# Patient Record
Sex: Female | Born: 1960 | Hispanic: Yes | Marital: Married | Smoking: Never smoker
Health system: Southern US, Community
[De-identification: ages and names within clinical notes are randomized; demographics above are authoritative.]

---

## 2014-02-28 ENCOUNTER — Emergency Department: Payer: Self-pay | Admitting: Emergency Medicine

## 2015-02-12 ENCOUNTER — Ambulatory Visit
Admission: RE | Admit: 2015-02-12 | Discharge: 2015-02-12 | Disposition: A | Discharge status: Home / Self Care | Payer: Self-pay | Source: Ambulatory Visit | Attending: Oncology | Admitting: Oncology

## 2015-02-12 ENCOUNTER — Ambulatory Visit: Payer: Self-pay | Attending: Oncology

## 2015-02-12 VITALS — BP 166/86 | HR 96 | Temp 97.5°F | Resp 20 | Ht 58.27 in | Wt 158.4 lb

## 2015-02-12 DIAGNOSIS — Z0000 Encounter for general adult medical examination without abnormal findings: Secondary | ICD-10-CM

## 2015-02-12 NOTE — Progress Notes (Signed)
Subjective:     Patient ID: Jamie Silva, female   DOB: [birth year], 54 y.o.   MRN: [id number]  HPI   Review of Systems     Objective:   Physical Exam  Pulmonary/Chest: Right breast exhibits no inverted nipple, no mass, no nipple discharge, no skin change and no tenderness. Left breast exhibits no inverted nipple, no mass, no nipple discharge, no skin change and no tenderness. Breasts are symmetrical.       Assessment:     54 year old hispanic patient presents for BCCCP clinic visit.  Patient screened, and meets BCCCP eligibility.  Patient does not have insurance, Medicare or Medicaid.  Handout given on Affordable Care Act.  No previous mammogram.  Instructed patient on breast self-exam using teach back method.  CBE unremarkable. No mass or lump palpated.    Plan:     Sent for bilateral screening mammogram.  Delos HaringLoyda Murr interpreted exam.

## 2015-02-25 NOTE — Progress Notes (Signed)
Letter mailed from [facility] to notify of normal mammogram results.  Patient to return in one year for annual screening.  Copy to [facility]. 

## 2016-12-02 DIAGNOSIS — T7840XA Allergy, unspecified, initial encounter: Secondary | ICD-10-CM | POA: Insufficient documentation

## 2016-12-02 MED ORDER — FAMOTIDINE 20 MG PO TABS
20.0000 mg | ORAL_TABLET | Freq: Once | ORAL | Status: AC
  Administered 2016-12-02: 20 mg via ORAL

## 2016-12-02 MED ORDER — FAMOTIDINE 20 MG PO TABS
ORAL_TABLET | ORAL | Status: AC
  Administered 2016-12-02: 20 mg via ORAL
  Filled 2016-12-02: qty 1

## 2016-12-02 MED ORDER — DIPHENHYDRAMINE HCL 25 MG PO CAPS
25.0000 mg | ORAL_CAPSULE | Freq: Once | ORAL | Status: AC
Start: 2016-12-02 — End: 2016-12-02
  Administered 2016-12-02: 25 mg via ORAL

## 2016-12-02 MED ORDER — PREDNISONE 20 MG PO TABS
ORAL_TABLET | ORAL | Status: AC
  Administered 2016-12-02: 60 mg via ORAL
  Filled 2016-12-02: qty 1

## 2016-12-02 MED ORDER — DIPHENHYDRAMINE HCL 25 MG PO CAPS
ORAL_CAPSULE | ORAL | Status: AC
  Administered 2016-12-02: 25 mg via ORAL
  Filled 2016-12-02: qty 1

## 2016-12-02 MED ORDER — PREDNISONE 20 MG PO TABS
60.0000 mg | ORAL_TABLET | Freq: Once | ORAL | Status: AC
  Administered 2016-12-02: 60 mg via ORAL

## 2016-12-02 NOTE — ED Triage Notes (Signed)
Pt has had hives for 3 days unsure of cause. States not improving took some benadryl this am, and did improve after taking it. States now in the evening hives have worsened again.

## 2016-12-03 ENCOUNTER — Emergency Department
Admission: EM | Admit: 2016-12-03 | Discharge: 2016-12-03 | Disposition: A | Discharge status: Home / Self Care | Payer: Self-pay | Attending: Emergency Medicine | Admitting: Emergency Medicine

## 2016-12-03 DIAGNOSIS — T7840XA Allergy, unspecified, initial encounter: Secondary | ICD-10-CM

## 2016-12-03 MED ORDER — DEXAMETHASONE SODIUM PHOSPHATE 10 MG/ML IJ SOLN
10.0000 mg | Freq: Once | INTRAMUSCULAR | Status: AC
  Administered 2016-12-03: 10 mg via INTRAMUSCULAR
  Filled 2016-12-03: qty 1

## 2016-12-03 MED ORDER — ONDANSETRON 4 MG PO TBDP
4.0000 mg | ORAL_TABLET | Freq: Once | ORAL | Status: AC
  Administered 2016-12-03: 4 mg via ORAL
  Filled 2016-12-03: qty 1

## 2016-12-03 NOTE — ED Provider Notes (Signed)
[facility] Emergency Department Provider Note  Time seen: 1:25 AM  I have reviewed the triage vital signs and the nursing notes.   HISTORY  Chief Complaint Rash    HPI Jamie Silva is a 56 y.o. female With a past medical history of diabetes who presents to the emergency department with hives. According to the patient for the past 2-3 days she has had a rash and hives all over her body. States she ate shellfish the day before the rash started but denies any other new changes. Patient did state she was put on new blood sugar medications on Monday but states she has not yet taken those medications. Denies any trouble breathing. Patient took Benadryl this morning and states the rash cleared up but then returned this afternoon so she came to the ER. Patient states mild nausea after receiving medications in the ER with one or 2 episodes of vomiting. Denies any nausea at home. Denies any shortness of breath. Denies any swelling.  No past medical history on file.  There are no active problems to display for this patient.   No past surgical history on file.  Prior to Admission medications   Not on File    No Known Allergies  No family history on file.  Social History Social History  Substance Use Topics  . Smoking status: Not on file  . Smokeless tobacco: Not on file  . Alcohol use Not on file    Review of Systems Constitutional: Negative for fever. Cardiovascular: Negative for chest pain. Respiratory: Negative for shortness of breath. Gastrointestinal: Negative for abdominal pain. Nausea with one episode of vomiting Skin: positive for rash, hives All other ROS negative  ____________________________________________   PHYSICAL EXAM:  VITAL SIGNS: ED Triage Vitals [12/02/16 2237]  Enc Vitals Group     BP 132/72     Pulse Rate 97     Resp 20     Temp 98 F (36.7 C)     Temp Source Oral     SpO2 100 %     Weight 125 lb 10.6  oz (57 kg)     Height      Head Circumference      Peak Flow      Pain Score      Pain Loc      Pain Edu?      Excl. in GC?     Constitutional: Alert. Well appearing and in no distress. Eyes: Normal exam ENT   Head: Normocephalic and atraumatic.   Mouth/Throat: Mucous membranes are moist. no edema noted Cardiovascular: Normal rate, regular rhythm. No murmur Respiratory: Normal respiratory effort without tachypnea nor retractions. Breath sounds are clear Gastrointestinal: Soft and nontender. No distention.  Musculoskeletal: Nontender with normal range of motion in all extremities.  Neurologic:  Normal speech and language. No gross focal neurologic deficits Skin:  patient has fairly diffuse urticaria, mild to moderate in severity. Psychiatric: Mood and affect are normal.   ____________________________________________  INITIAL IMPRESSION / ASSESSMENT AND PLAN / ED COURSE  Pertinent labs & imaging results that were available during my care of the patient were reviewed by me and considered in my medical decision making (see chart for details).  patient presents to the emergency department with a rash. Differential this time would include allergic reaction, histamine response, viral rash.rash overall looks most consistent with allergic reaction/urticaria. Patient vomited shortly after receiving the medications, denies any nausea at home. We will dose of ODT Zofran.  I will also does a one-time dose of IM Decadron. I discussed with the patient taking Benadryl every 6 hours as needed if the rash returns. I also discussed following up with her primary care doctor for possible allergist referral if she continues to have hives.overall the patient appears very well, no difficulty breathing, no edema and no wheeze.  ____________________________________________   FINAL CLINICAL IMPRESSION(S) / ED DIAGNOSES  allergic reaction    Minna Antis, MD 12/03/16 0130

## 2016-12-03 NOTE — ED Notes (Signed)

## 2019-07-04 ENCOUNTER — Other Ambulatory Visit: Payer: Self-pay

## 2019-07-04 ENCOUNTER — Ambulatory Visit: Payer: Self-pay | Attending: Oncology

## 2019-07-04 ENCOUNTER — Encounter (INDEPENDENT_AMBULATORY_CARE_PROVIDER_SITE_OTHER): Payer: Self-pay

## 2019-07-04 ENCOUNTER — Ambulatory Visit
Admission: RE | Admit: 2019-07-04 | Discharge: 2019-07-04 | Disposition: A | Discharge status: Home / Self Care | Payer: Self-pay | Source: Ambulatory Visit | Attending: Oncology | Admitting: Oncology

## 2019-07-04 VITALS — BP 136/52 | HR 75 | Temp 97.4°F | Ht 59.5 in | Wt 143.9 lb

## 2019-07-04 DIAGNOSIS — Z0000 Encounter for general adult medical examination without abnormal findings: Secondary | ICD-10-CM | POA: Insufficient documentation

## 2019-07-04 NOTE — Progress Notes (Signed)
  Subjective:     Patient ID: Jamie Silva, female   DOB: 1961-02-04, 59 y.o.   MRN: [id number]  HPI   Review of Systems     Objective:   Physical Exam Chest:       Comments: White thickened area on right areola Genitourinary:    Labia:        Right: No rash, tenderness, lesion or injury.        Left: No rash, tenderness, lesion or injury.      Urethra: Prolapse present.     Vagina: No signs of injury and foreign body. No vaginal discharge, erythema, tenderness, bleeding, lesions or prolapsed vaginal walls.     Cervix: No cervical motion tenderness, discharge, friability, lesion, erythema, cervical bleeding or eversion.     Uterus: Not deviated, not enlarged, not fixed, not tender and no uterine prolapse.      Adnexa:        Right: No mass, tenderness or fullness.         Left: No mass, tenderness or fullness.          Assessment:     59 year old hispanic patient returns for BCCCP screening. Patient screened, and meets BCCCP eligibility.  Patient does not have insurance, Medicare or Medicaid. Instructed patient on breast self awareness using teach back method. Clinical breast exam unremarkable. No mass or lump palpated.  Noted a white rough skin on right areola.  Patient states it is there after she showers.  Does not itch. Will monitor mammogram results.  Patient to follow-up with provider.  Pelvic exam scan bleeding on pap.  Risk Assessment    Risk Scores      07/04/2019   Last edited by: Jim Like, RN   5-year risk: 0.8 %   Lifetime risk: 4.9 %            Plan:     Sent for bilateral screening mammogram. Specimen collected for pap.

## 2019-07-06 NOTE — Progress Notes (Signed)
Letter mailed from [facility] to notify of normal mammogram results.  Pap results pending.  Letter mailed to notify of normal pap results. Copy to [facility].

## 2019-07-07 LAB — IGP, APTIMA HPV: HPV Aptima: NEGATIVE

## 2021-01-04 ENCOUNTER — Encounter: Payer: Self-pay | Admitting: Emergency Medicine

## 2021-01-04 ENCOUNTER — Emergency Department
Admission: EM | Admit: 2021-01-04 | Discharge: 2021-01-04 | Disposition: A | Discharge status: Home / Self Care | Payer: No Typology Code available for payment source | Attending: Student in an Organized Health Care Education/Training Program | Admitting: Student in an Organized Health Care Education/Training Program

## 2021-01-04 ENCOUNTER — Other Ambulatory Visit: Payer: Self-pay

## 2021-01-04 DIAGNOSIS — R251 Tremor, unspecified: Secondary | ICD-10-CM | POA: Insufficient documentation

## 2021-01-04 DIAGNOSIS — S199XXA Unspecified injury of neck, initial encounter: Secondary | ICD-10-CM | POA: Diagnosis present

## 2021-01-04 DIAGNOSIS — M7918 Myalgia, other site: Secondary | ICD-10-CM

## 2021-01-04 MED ORDER — NAPROXEN 500 MG PO TABS: 500.0000 mg | ORAL_TABLET | Freq: Two times a day (BID) | ORAL | 0 refills | Status: AC

## 2021-01-04 MED ORDER — NAPROXEN 500 MG PO TABS
500.0000 mg | ORAL_TABLET | Freq: Once | ORAL | Status: AC
  Administered 2021-01-04: 500 mg via ORAL
  Filled 2021-01-04: qty 1

## 2021-01-04 MED ORDER — DIAZEPAM 2 MG PO TABS
2.0000 mg | ORAL_TABLET | Freq: Once | ORAL | Status: AC
  Administered 2021-01-04: 2 mg via ORAL
  Filled 2021-01-04: qty 1

## 2021-01-04 MED ORDER — METHOCARBAMOL 500 MG PO TABS: 500.0000 mg | ORAL_TABLET | Freq: Three times a day (TID) | ORAL | 0 refills | Status: AC | PRN
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
## 2021-01-09 IMAGING — MG DIGITAL SCREENING BILAT W/ TOMO W/ CAD
8 series · 9 of 24 positions shown · non-contrast
Comparison: Previous exam(s).

CLINICAL DATA: Screening.

EXAM:
DIGITAL SCREENING BILATERAL MAMMOGRAM WITH TOMO AND CAD

[L CC synth-2D]
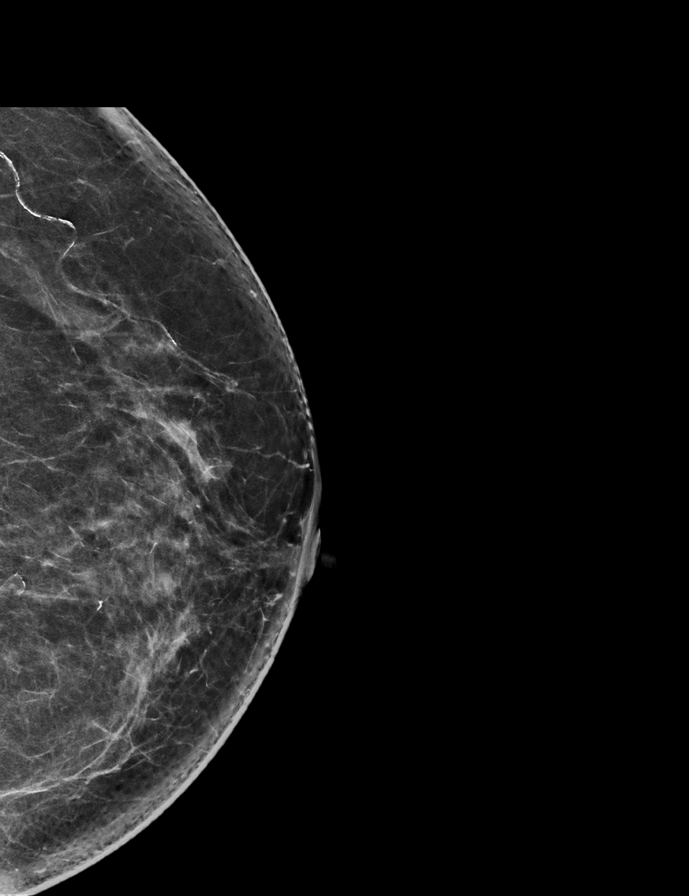

[R CC synth-2D]
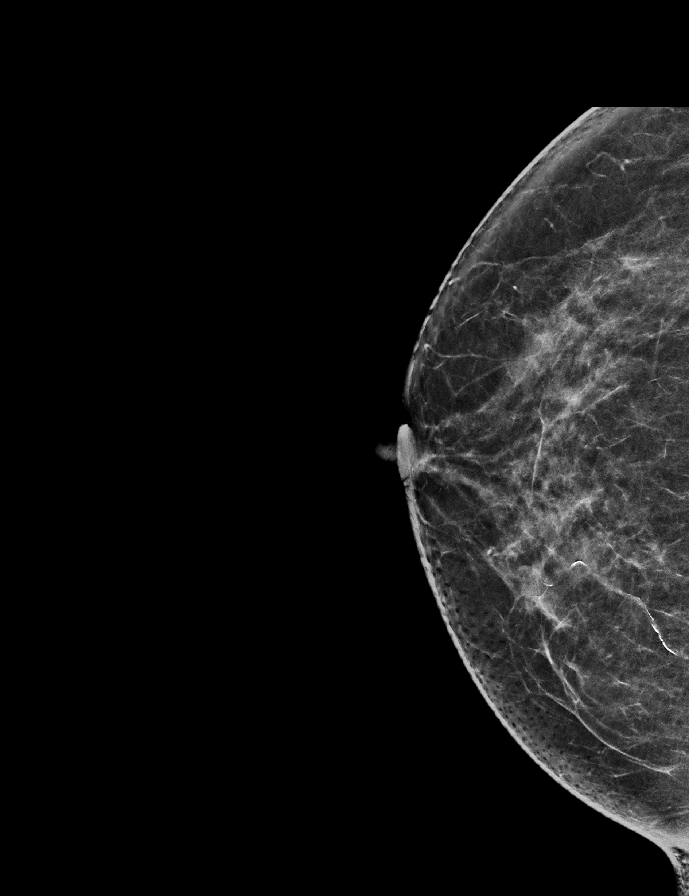

[R MLO synth-2D]
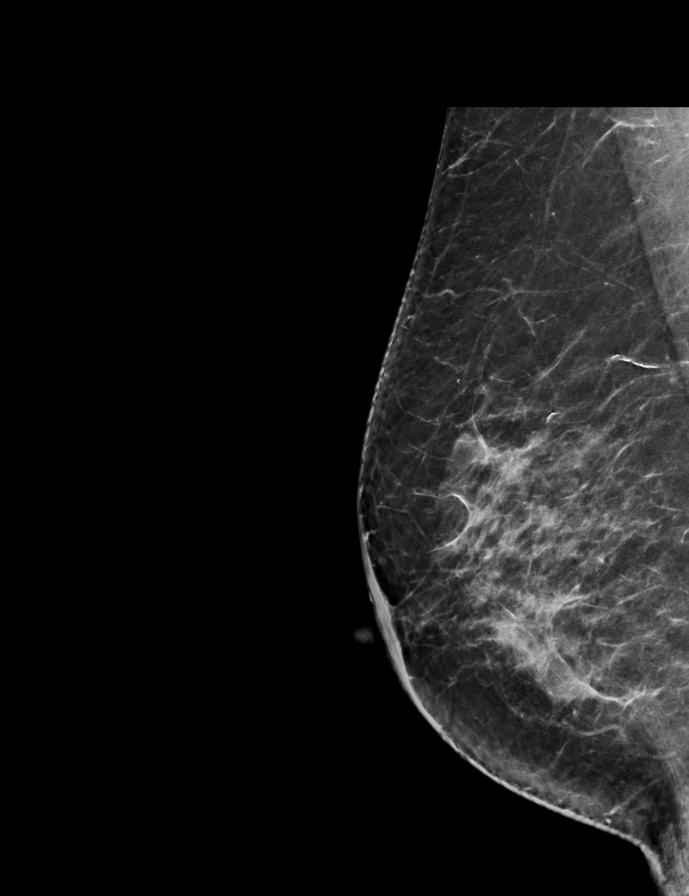

[L MLO synth-2D]
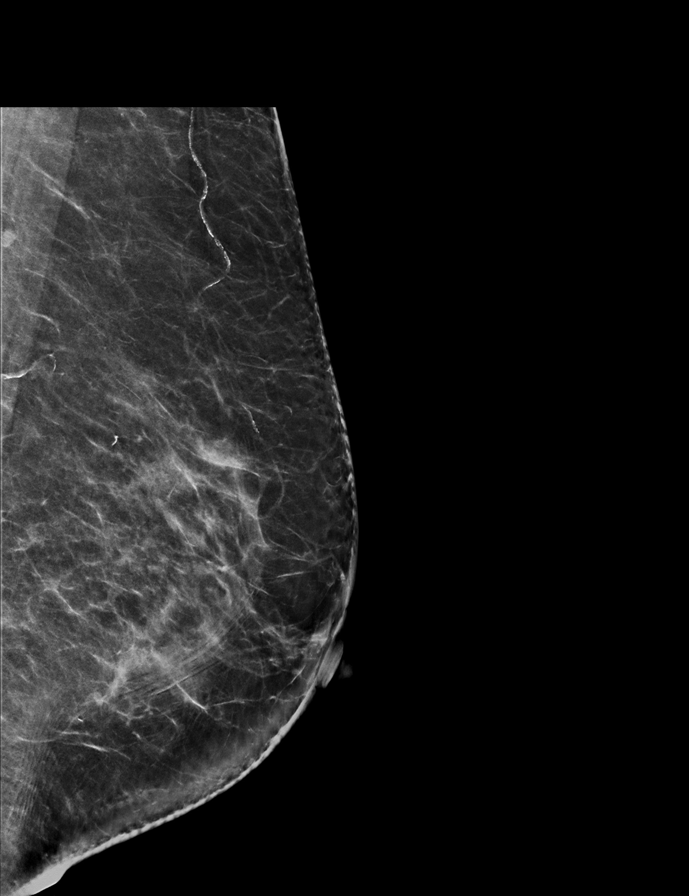

[R MLO tomo · 2 of 72 frames shown]
[frame 24/72]
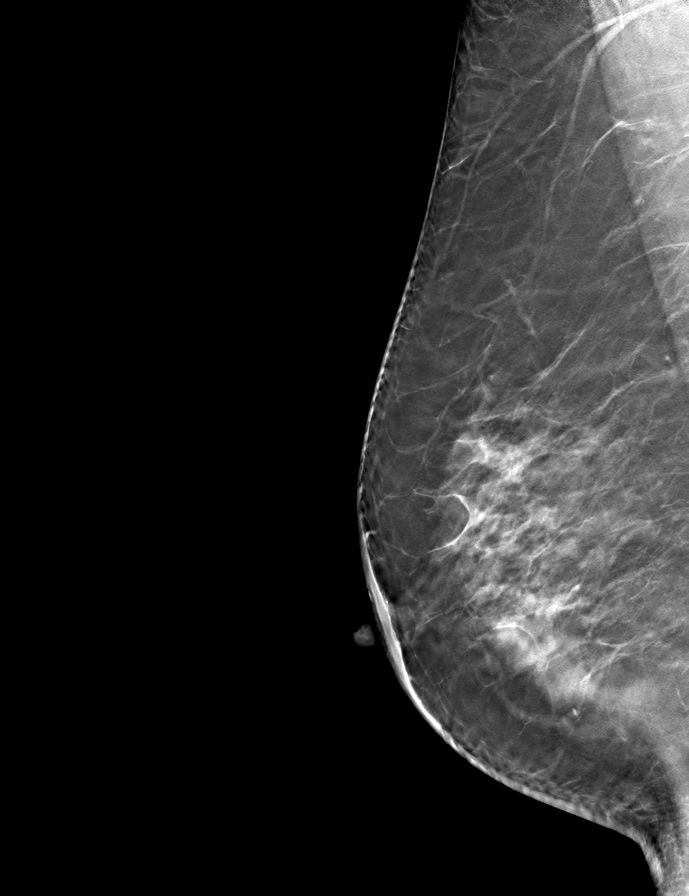
[frame 37/72]
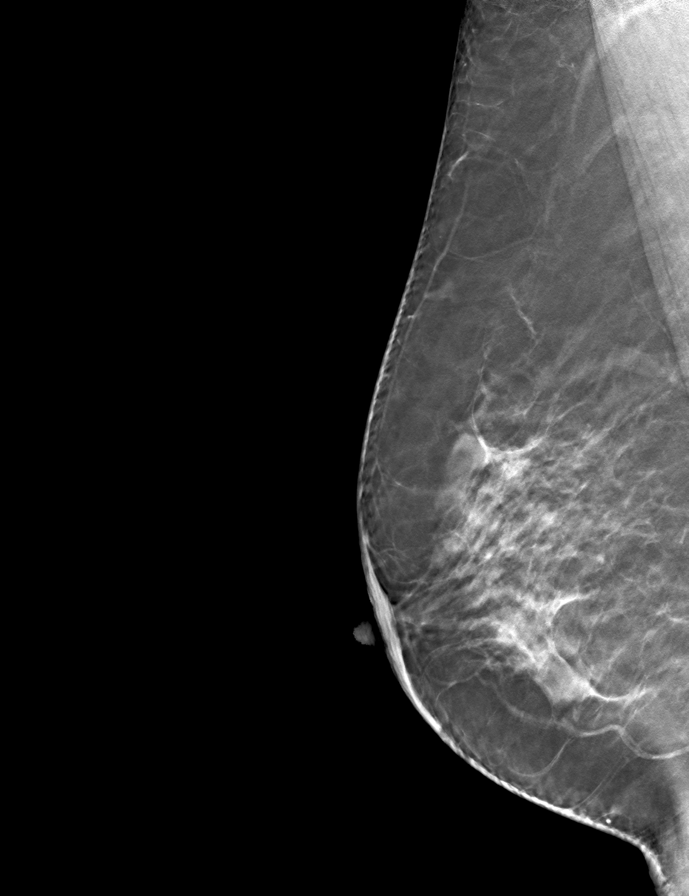

[R CC tomo · tomo slice 33/64.0]
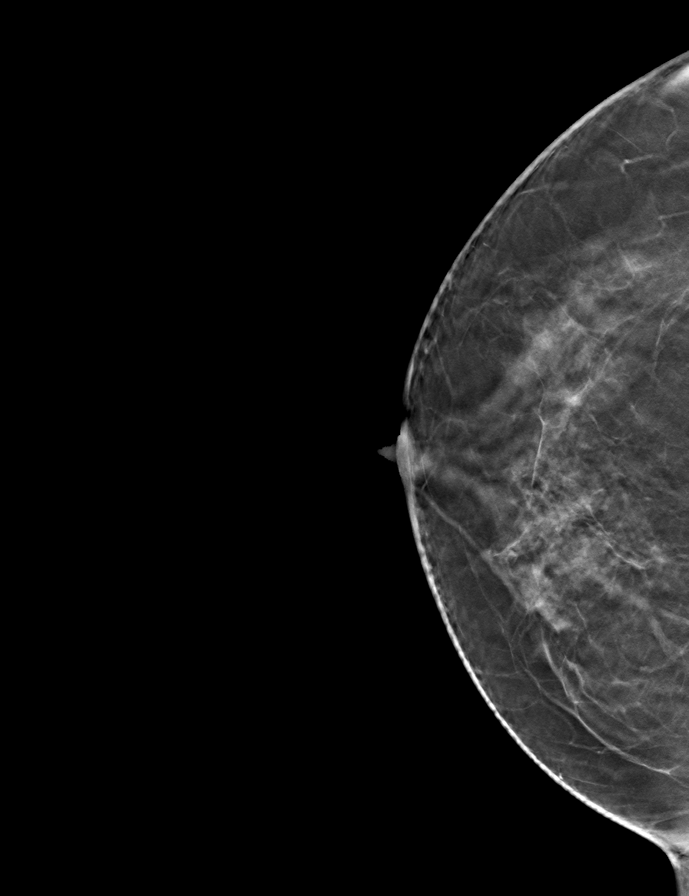

[L CC tomo · tomo slice 35/70.0]
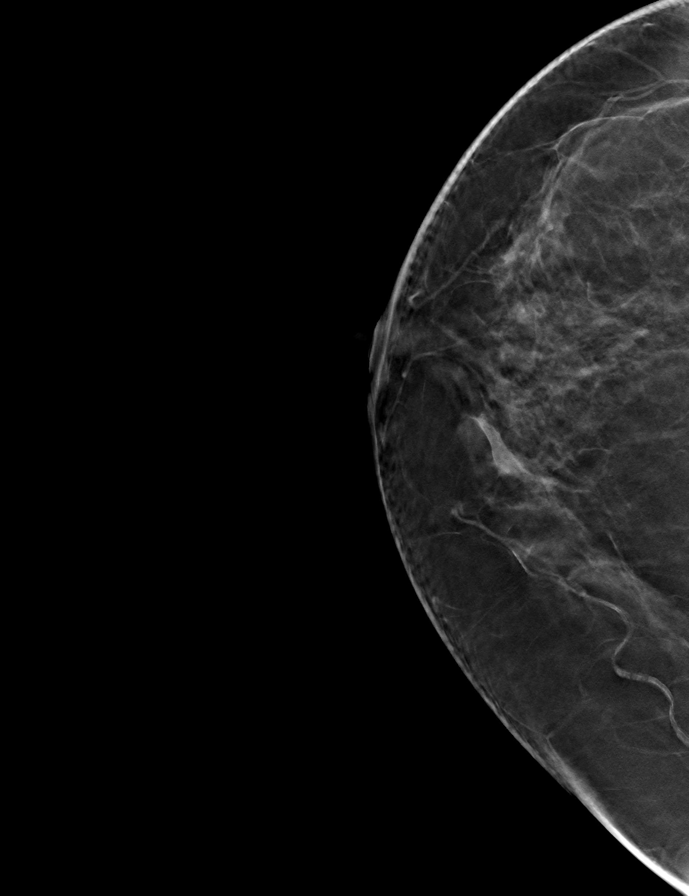

[L MLO tomo · tomo slice 39/76.0]
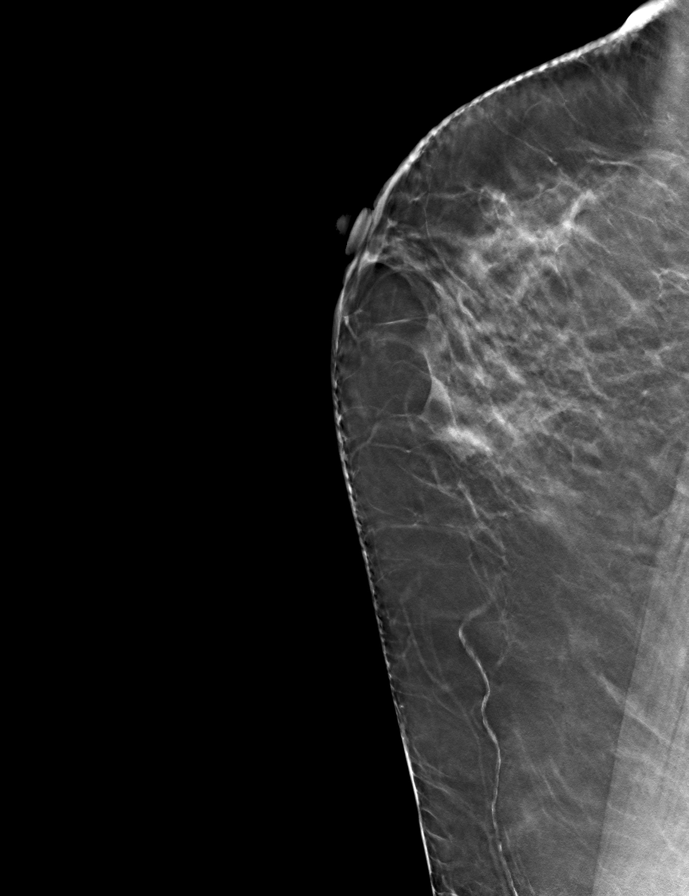

[9 of 24 positions shown; findings below may reference images not displayed]

ACR Breast Density Category c: The breast tissue is heterogeneously
dense, which may obscure small masses.
FINDINGS: There are no findings suspicious for malignancy. Images were
processed with CAD.
IMPRESSION: No mammographic evidence of malignancy. A result letter of this
screening mammogram will be mailed directly to the patient.

RECOMMENDATION:
Screening mammogram in one year. (Code:FT-U-LHB)

BI-RADS CATEGORY  1: Negative.

## 2021-05-18 ENCOUNTER — Other Ambulatory Visit: Payer: Self-pay

## 2021-05-18 ENCOUNTER — Emergency Department
Admission: EM | Admit: 2021-05-18 | Discharge: 2021-05-18 | Disposition: A | Discharge status: Home / Self Care | Payer: Self-pay | Attending: Emergency Medicine | Admitting: Emergency Medicine

## 2021-05-18 DIAGNOSIS — N3001 Acute cystitis with hematuria: Secondary | ICD-10-CM | POA: Insufficient documentation

## 2021-05-18 DIAGNOSIS — I10 Essential (primary) hypertension: Secondary | ICD-10-CM | POA: Insufficient documentation

## 2021-05-18 DIAGNOSIS — E119 Type 2 diabetes mellitus without complications: Secondary | ICD-10-CM | POA: Insufficient documentation

## 2021-05-18 LAB — URINALYSIS, COMPLETE (UACMP) WITH MICROSCOPIC
Bacteria, UA: NONE SEEN
Bilirubin Urine: NEGATIVE
Glucose, UA: >=500 mg/dL — AB
Ketones, ur: NEGATIVE mg/dL
Nitrite: NEGATIVE
Protein, ur: 100 mg/dL — AB
RBC / HPF: >50 RBC/hpf — ABNORMAL HIGH (ref 0–5)
Specific Gravity, Urine: 1.029 (ref 1.005–1.030)
WBC, UA: >50 WBC/hpf — ABNORMAL HIGH (ref 0–5)
pH: 6 (ref 5.0–8.0)

## 2021-05-18 MED ORDER — CEPHALEXIN 500 MG PO CAPS
500.0000 mg | ORAL_CAPSULE | Freq: Four times a day (QID) | ORAL | 0 refills | Status: AC
Start: 2021-05-18 — End: 2021-05-25

## 2021-05-18 MED ORDER — PHENAZOPYRIDINE HCL 100 MG PO TABS: 100.0000 mg | ORAL_TABLET | Freq: Three times a day (TID) | ORAL | 0 refills | Status: AC | PRN

## 2021-05-18 MED ORDER — CEPHALEXIN 500 MG PO CAPS
500.0000 mg | ORAL_CAPSULE | Freq: Once | ORAL | Status: AC
  Administered 2021-05-18: 500 mg via ORAL
  Filled 2021-05-18: qty 1

## 2021-05-18 MED ORDER — PHENAZOPYRIDINE HCL 100 MG PO TABS
95.0000 mg | ORAL_TABLET | Freq: Once | ORAL | Status: AC
Start: 2021-05-18 — End: 2021-05-18
  Administered 2021-05-18: 100 mg via ORAL
  Filled 2021-05-18: qty 1

## 2021-05-18 NOTE — ED Provider Notes (Signed)
? ?[facility] ?Provider Note ? ? ? Event Date/Time  ? First MD Initiated Contact with Patient 05/18/21 0325   ?  (approximate) ? ? ?History  ? ?Dysuria ? ? ?HPI ? ?Jamie Silva is a 61 y.o. female with a past medical history of diabetes and hypertension who presents coming by spouse for assessment of 3 to 4 days of some burning with urination associated with some intermittent gross bloody urine.  No other discharge, abdominal pain, back pain, fevers, nausea, vomiting, diarrhea, rash or extremity pain.  No chest pain, cough, shortness of breath or any other clear associated sick symptoms.  No other acute concerns at this time. ? ?  ? ? ?Physical Exam  ?Triage Vital Signs: ?ED Triage Vitals  ?Enc Vitals Group  ?   BP 05/18/21 0322 (!) 155/71  ?   Pulse Rate 05/18/21 0322 76  ?   Resp 05/18/21 0322 18  ?   Temp 05/18/21 0322 98.8 ?F (37.1 ?C)  ?   Temp Source 05/18/21 0322 Oral  ?   SpO2 05/18/21 0322 99 %  ?   Weight 05/18/21 0315 160 lb (72.6 kg)  ?   Height 05/18/21 0315 5\' 2"  (1.575 m)  ?   Head Circumference --   ?   Peak Flow --   ?   Pain Score 05/18/21 0316 5  ?   Pain Loc --   ?   Pain Edu? --   ?   Excl. in GC? --   ? ? ?Most recent vital signs: ?Vitals:  ? 05/18/21 0322  ?BP: (!) 155/71  ?Pulse: 76  ?Resp: 18  ?Temp: 98.8 ?F (37.1 ?C)  ?SpO2: 99%  ? ? ?General: Awake, no distress.  ?CV:  Good peripheral perfusion.  2+ radial pulses. ?Resp:  Normal effort.  ?Abd:  No distention.  Soft throughout.  No CVA tenderness. ?Other:   ? ? ?ED Results / Procedures / Treatments  ?Labs ?(all labs ordered are listed, but only abnormal results are displayed) ?Labs Reviewed  ?URINALYSIS, COMPLETE (UACMP) WITH MICROSCOPIC - Abnormal; Notable for the following components:  ?    Result Value  ? Color, Urine YELLOW (*)   ? APPearance CLOUDY (*)   ? Glucose, UA >=500 (*)   ? Hgb urine dipstick LARGE (*)   ? Protein, ur 100 (*)   ? Leukocytes,Ua LARGE (*)   ? RBC / HPF >50 (*)   ? WBC, UA  >50 (*)   ? All other components within normal limits  ?URINE CULTURE  ? ? ? ?EKG ? ? ? ? ?RADIOLOGY ? ? ? ?PROCEDURES: ? ? ? ? ?MEDICATIONS ORDERED IN ED: ?Medications  ?phenazopyridine (PYRIDIUM) tablet 100 mg (has no administration in time range)  ?cephALEXin (KEFLEX) capsule 500 mg (has no administration in time range)  ? ? ? ?IMPRESSION / MDM / ASSESSMENT AND PLAN / ED COURSE  ?I reviewed the triage vital signs and the nursing notes. ?             ?               ? ?Patient presents with above-stated exam for couple days of burning with urination associated with some blood mixed with urine.  She has no fever or other abnormal breath sounds or CVA tenderness to suggest a kidney stone, pyelonephritis or other acute intra-abdominal process and I do not believe she is septic.  UA shows evidence of cystitis with  large leukocyte esterase and greater than 50 RBCs, WBCs and some glucose noted.  Urine culture sent.  Low suspicion for other immediate life-threatening process at this time.  We will treat with a course of Keflex and some Pyridium for symptoms.  Advised outpatient follow-up to have blood pressure rechecked.  Discharged in stable condition.  Strict return precautions advised and discussed. ?  ? ? ?FINAL CLINICAL IMPRESSION(S) / ED DIAGNOSES  ? ?Final diagnoses:  ?Acute cystitis with hematuria  ?Hypertension, unspecified type  ? ? ? ?Rx / DC Orders  ? ?ED Discharge Orders   ? ?      Ordered  ?  phenazopyridine (PYRIDIUM) 100 MG tablet  3 times daily PRN       ? 05/18/21 0343  ?  cephALEXin (KEFLEX) 500 MG capsule  4 times daily       ? 05/18/21 0343  ? ?  ?  ? ?  ? ? ? ?Note:  This document was prepared using Dragon voice recognition software and may include unintentional dictation errors. ?  ?Gilles Chiquito, MD ?05/18/21 [phone] ? ?

## 2021-05-18 NOTE — ED Triage Notes (Signed)
Burning with urination, urgency and frequency with small amounts of blood noted x 2 days.  ?

## 2021-05-22 LAB — URINE CULTURE: Culture: 30000 — AB

## 2021-10-07 ENCOUNTER — Ambulatory Visit: Payer: Self-pay

## 2021-10-14 ENCOUNTER — Other Ambulatory Visit: Payer: Self-pay

## 2021-10-14 DIAGNOSIS — Z1231 Encounter for screening mammogram for malignant neoplasm of breast: Secondary | ICD-10-CM

## 2021-10-19 ENCOUNTER — Ambulatory Visit: Payer: Self-pay | Attending: Hematology and Oncology | Admitting: *Deleted

## 2021-10-19 ENCOUNTER — Ambulatory Visit
Admission: RE | Admit: 2021-10-19 | Discharge: 2021-10-19 | Disposition: A | Discharge status: Home / Self Care | Payer: Self-pay | Source: Ambulatory Visit | Attending: Obstetrics and Gynecology | Admitting: Obstetrics and Gynecology

## 2021-10-19 ENCOUNTER — Other Ambulatory Visit: Payer: Self-pay

## 2021-10-19 VITALS — BP 156/73 | Wt 139.8 lb

## 2021-10-19 DIAGNOSIS — Z1231 Encounter for screening mammogram for malignant neoplasm of breast: Secondary | ICD-10-CM | POA: Insufficient documentation

## 2021-10-19 DIAGNOSIS — Z1211 Encounter for screening for malignant neoplasm of colon: Secondary | ICD-10-CM

## 2021-10-19 DIAGNOSIS — Z1239 Encounter for other screening for malignant neoplasm of breast: Secondary | ICD-10-CM

## 2021-10-19 NOTE — Patient Instructions (Signed)
Explained breast self awareness with [facility]. Patient did not need a Pap smear today due to last Pap smear and HPV typing was 07/04/2019. Let her know BCCCP will cover Pap smears and HPV typing every 5 years unless has a history of abnormal Pap smears. Referred patient to the [facility] for a screening mammogram. Appointment scheduled Monday, October 19, 2021 at 1440. Patient aware of appointment and will be there. Let patient know Delford Field will follow up with her within the next couple weeks with results of mammogram by letter or phone. [facility] verbalized understanding.  [place], Kathaleen Maser, RN 1:52 PM

## 2021-10-19 NOTE — Progress Notes (Signed)
[name] is a 61 y.o. female who presents to [facility] clinic today with complaint of bilateral nipple dryness x 2 years that her PCP has prescribed a cream that she uses as needed and symptoms improve.    Pap Smear: Pap smear not completed today. Last Pap smear was 07/04/2019 at [facility] clinic and was normal with negative HPV. Per patient has no history of an abnormal Pap smear. Last Pap smear result is available in Epic.   Physical exam: Breasts Breasts symmetrical. Slight bilateral nipple dryness observed on exam. No nipple retraction bilateral breasts. No nipple discharge bilateral breasts. No lymphadenopathy. No lumps palpated bilateral breasts. No complaints of pain or tenderness on exam.     MS DIGITAL SCREENING TOMO BILATERAL  Result Date: 07/04/2019 CLINICAL DATA:  Screening. EXAM: DIGITAL SCREENING BILATERAL MAMMOGRAM WITH TOMO AND CAD COMPARISON:  Previous exam(s). ACR Breast Density Category c: The breast tissue is heterogeneously dense, which may obscure small masses. FINDINGS: There are no findings suspicious for malignancy. Images were processed with CAD. IMPRESSION: No mammographic evidence of malignancy. A result letter of this screening mammogram will be mailed directly to the patient. RECOMMENDATION: Screening mammogram in one year. (Code:[id number]) BI-RADS CATEGORY  1: Negative. Electronically Signed   By: Frederico Hamman M.D.   On: 07/04/2019 16:51     Pelvic/Bimanual Pap is not indicated today per BCCCP guidelines.   Smoking History: Patient has never smoked.   Patient Navigation: Patient education provided. Access to services provided for patient through [employer] program. Spanish interpreter Lowella Fairy from [facility] provided.   Colorectal Cancer Screening: Per patient has never had colonoscopy completed. FIT Test given to patient to complete. No complaints today.    Breast and Cervical Cancer Risk Assessment: Patient does not have  family history of breast cancer, known genetic mutations, or radiation treatment to the chest before age 50. Patient does not have history of cervical dysplasia, immunocompromised, or DES exposure in-utero.  Risk Assessment     Risk Scores       10/19/2021 07/04/2019   Last edited by: Narda Rutherford, LPN Jim Like, RN   5-year risk: 0.8 % 0.8 %   Lifetime risk: 4.7 % 4.9 %            A: BCCCP exam without pap smear Complaint of bilateral nipple dryness.  P: Referred patient to the [facility] for a screening mammogram. Appointment scheduled Monday, October 19, 2021 at 1440.  Priscille Heidelberg, RN 10/19/2021 1:52 PM

## 2022-09-29 ENCOUNTER — Encounter: Payer: Self-pay | Admitting: Physician Assistant

## 2023-11-03 ENCOUNTER — Telehealth: Payer: Self-pay | Admitting: *Deleted
